# Patient Record
Sex: Female | Born: 1980 | Race: White | Hispanic: No | Marital: Married | Smoking: Never smoker
Health system: Western US, Academic
[De-identification: ages and names within clinical notes are randomized; demographics above are authoritative.]

---

## 2013-05-14 ENCOUNTER — Ambulatory Visit (INDEPENDENT_AMBULATORY_CARE_PROVIDER_SITE_OTHER): Payer: BC Managed Care – PPO

## 2013-05-14 ENCOUNTER — Encounter (INDEPENDENT_AMBULATORY_CARE_PROVIDER_SITE_OTHER): Payer: Self-pay

## 2013-05-14 VITALS — BP 120/70 | HR 64 | Temp 98.1°F | Ht 64.0 in | Wt 115.0 lb

## 2013-05-14 DIAGNOSIS — M79661 Pain in right lower leg: Secondary | ICD-10-CM

## 2013-05-14 DIAGNOSIS — M25561 Pain in right knee: Secondary | ICD-10-CM

## 2013-05-14 DIAGNOSIS — M25569 Pain in unspecified knee: Principal | ICD-10-CM

## 2013-05-14 DIAGNOSIS — M79609 Pain in unspecified limb: Secondary | ICD-10-CM

## 2013-05-15 ENCOUNTER — Ambulatory Visit (INDEPENDENT_AMBULATORY_CARE_PROVIDER_SITE_OTHER): Payer: BC Managed Care – PPO

## 2013-05-15 ENCOUNTER — Encounter (INDEPENDENT_AMBULATORY_CARE_PROVIDER_SITE_OTHER): Payer: BC Managed Care – PPO | Admitting: Physical Medicine and Rehab

## 2013-05-15 ENCOUNTER — Ambulatory Visit (INDEPENDENT_AMBULATORY_CARE_PROVIDER_SITE_OTHER): Payer: BC Managed Care – PPO | Admitting: Physical Medicine and Rehab

## 2013-05-15 ENCOUNTER — Encounter (INDEPENDENT_AMBULATORY_CARE_PROVIDER_SITE_OTHER): Payer: Self-pay

## 2013-05-15 ENCOUNTER — Inpatient Hospital Stay (INDEPENDENT_AMBULATORY_CARE_PROVIDER_SITE_OTHER)
Admit: 2013-05-15 | Discharge: 2013-05-15 | Disposition: A | Discharge status: Home / Self Care | Payer: BC Managed Care – PPO

## 2013-05-15 VITALS — BP 149/88 | HR 73 | Temp 97.7°F

## 2013-05-15 VITALS — BP 138/73 | HR 77 | Temp 97.9°F | Ht 64.0 in | Wt 115.0 lb

## 2013-05-15 DIAGNOSIS — G5730 Lesion of lateral popliteal nerve, unspecified lower limb: Principal | ICD-10-CM

## 2013-05-15 DIAGNOSIS — S8410XA Injury of peroneal nerve at lower leg level, unspecified leg, initial encounter: Secondary | ICD-10-CM

## 2013-05-15 DIAGNOSIS — M25569 Pain in unspecified knee: Principal | ICD-10-CM

## 2013-05-15 DIAGNOSIS — M84369A Stress fracture, unspecified tibia and fibula, initial encounter for fracture: Principal | ICD-10-CM

## 2013-05-15 NOTE — Progress Notes (Addendum)
CLINIC: [facility]  REPORT TYPE: NOTE  Dictating Practitioner: Teresita MaduraHRISTOPHER J Richardson Dubree, M.D.  DATE OF SERVICE:  05/14/2013  REASON FOR VISIT: Right leg pain.    HISTORY OF PRESENT ILLNESS:  The patient is a Adult nursedelightful, athletic, young triathlete whom I am seeing today at the request of Emelia LoronBrian Hill for evaluation of her right leg.  She is [place] in [place] [facility] [place] and on vacation after the [place].    Basically she was in her normal state of health until about January 2015. At that time with the increased running program, she started to develop pain, particularly when she crossed her leg on the posterior aspect of her knee.  She iced and stretched and tried to improve this and got some nominal improvement.  She started a heavy training block in [place] in February  and noticed almost immediate increase in the posterolateral aspect of her knee and had a limp. She took 2 weeks off.  Once again this past weekend she tried to do a tri[place] this [place] in [place]. Basically she was fine during the swim and the bike and when she started running she was fine, until about mile 6 where she started to notice this pain, and by mile 9 she was in tears and had to stop running.  She describes the pain as sharp, throbbing, and stabbing; sometimes radicular symptoms running all the way from her buttocks region down and into the leg and particularly the anterolateral aspect of the leg.  It is accompanied by some stiffness, although she has not noted a rock-hard compartment at that area.  The symptoms have been constant.  They are significant with activity.  She has not seen any other physicians or had any workup for this problem.    PAST MEDICAL HISTORY:  Unremarkable.    ALLERGIES:  TO MORPHINE.    MEDICATIONS:  She put herself on a Medrol Dosepak, as she is a PA and was concerned about some of the neurologic symptoms she was having.    SURGICAL HISTORY:  She has had a right ACL reconstruction in 2004 on the  ipsilateral side, and an elbow ORIF in 2005 without complications.    FAMILY HISTORY:  Unremarkable.    REVIEW OF SYSTEMS:  On a 14-system review of systems, she declined any pertinent positives.    SOCIAL HISTORY:  She is a self-employed PA, currently working for [employer].  She is a married female who has never used tobacco, drinks wine only socially, and does not use recreational drugs. She is an Conservation officer, historic buildingsronman athlete.    PHYSICAL EXAMINATION:  On physical examination she is an in-shape female in no apparent distress; has no obvious hair, eye, or skin abnormalities.  She has an obvious antalgic gait with pain when she tries to actively plantarflex the foot or with weightbearing.  Most of the pain is over the posterolateral aspect of the knee; it starts just proximal to the fibular head and courses around the fibular neck and into the anterior compartment, along the course of the peroneal nerve.  She has fairly exquisite tenderness even with light touch in the peroneal lateral compartment; however, interestingly she has no pain with passive stretch on the compartment and no swelling, and the compartment does not feel tense or woody.  Passive stretch does not give her pain; active attempts at dorsiflexion or eversion caused pain, again along the peroneal distribution, with a radicular nature.  Sensation is intact in the 1st  dorsal web space.  She has got 5/5 strength in ankle dorsiflexion and EHL; resisted plantarflexion does give her some discomfort.    IMPRESSION:  Certainly a strange constellation of symptoms that could fit a few different differential diagnoses.  One might be a proximal fibular stress fracture or stress reaction.  Secondarily she may have a peroneal nerve entrapment syndrome where she is getting peroneal nerve entrapment as it is entering the fascia around the peroneal musculature.  Less likely would be a chronic exertional compartment syndrome, as this does not exactly fit with her   appearance.  She does not appear to have any acute compartment syndrome at this time.  My thought was to get an MRI of the leg and knee to assess for whether or not she might have a Baker's cyst (related to her ACL reconstruction) or a ganglion cyst from the tib-fib joint compressing the peroneal nerve; it is also to assess to see whether or not there is some component of a stress reaction in the fibula; if these are negative, I still might recommend a peroneal nerve EMG to assess this, or exertional compartment testing, although at this point she could never complete the test.  I do think again there is a very, very low index of suspicion for an acute compartment syndrome, given the good muscle development, no pain on passive stretch, and stable symptomatology.

## 2013-05-15 NOTE — Progress Notes (Signed)
NERVE CONDUCTION STUDIES/ELECTROMYOGRAPHY EXAMINATION    PRIMARY CARE PROVIDER  Provider, Not In System    CONSULTING/REFERRING PHYSICIAN  Provider, Not In System    Gaynel Silbernagel 33 year old female  VS: There were no vitals taken for this visit.   BMI: There is no weight on file to calculate BMI.  Hand Dominance: n/a    CC/Reason for referral:  No chief complaint on file.    No chief complaint on file.      HPI: Referred for electrophysiologic examination due to following symptoms: R lateral knee pain in Iron[employer] triathlete, altered sensations and parasthesias.    PMH: No past medical history on file.    PSH: No past surgical history on file.    A brief neurological examination demonstrated Tinel's at fibular neck, neg at tarsal tunnel. She can grossly move her ankle in dorsiflexion, plantarflexion, inversion and eversion although she reports significant pain with this and thus limited testing of motor strength.       Contraindications/Restrictions:  Implantable cardiac defibrillator: no  Cardiac pacemaker: no  Other implantable device: no  Central lines: none  On anticoagulation: denies  Bleeding disorder: denies  Open wound: none  Current skin infection: none    Study limitations:  Lotion on skin resulted in interference/poor baseline on sural sensory but opposite side superficial peroneal sensory was obtainable. She had significant pain during NCS and EMG and reduced tolerance to testing.    Nerve Conduction Studies:  For sensory NCS, the amplitude is measured peak-to-peak, the latency reported is the distal peak latency, and the conduction velocity, if measured, is determined from onset latencies. For motor nerve conduction studies, the amplitude is measured baseline-to-peak, the latency reported is the distal onset latency, the conduction velocity is calculated from the latencies. Please see attached data set for individual values.    Limb temperature:  Unless otherwise noted, the limb temperature was monitored  continuously and remained between at least 32C for upper extremities (30C for lower) and 36C during the performance of the NCSs.     Needle Electromyography:  The study was performed with a new sterile disposable monopolar needle electrode, which was then discarded. There were no complications. Fibrillation and fasciculation activity is graded from none (0) to continuous (4+). The configuration and recruitment pattern of motor unit action potentials under voluntary control, if not normal, are described in attached chart.     Summary of Findings:  Please see attached data.    Diagnostic Interpretation:  1. Abnormal study.  2. 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 Tibial motor nerve was within normal limits.  3. There was no clear electrodiagnostic evidence to suggest peripheral neuropathy or radiculopathy on current study.   4. Limitations on interpretation: mild/minimal due to technical factors listed above. Test quality: adequate.  5. Previous studies: none.    Recommendations:  1. Results were discussed with the patient. Reviewed nerve recovery expectations for demyelinating vs axonal injuries if nonsurgical intervention is planned.  2. Additional NCS and needle EMG examination may be done to monitor for improvement or if further testing is desired such as inching studies across the fibular head.  3. MRI 'in process,' no report uploaded at time of study however she states she knows she has a stress fracture proximal  fibula.  4. She states she is already on steroid taper which may help if there is inflammation affecting peroneal nerve.   5. Follow up: she has a follow up appointment today with Dr. Smith MinceWahl. [facility] and [facility] to relay  findings.      Physician performing the study: Lupe CarneyKenneth Daden Mahany, MD  Technician: none.  Instrument manufacturer/model: [employer]well Sierra Wave.     [facility]  [address], [address], [place], [place] [id number]  [phone]  Test Date:  05/15/2013    Patient: Maree Krabbeatiana Topor Physician: Lupe CarneyKenneth Tivis Wherry, M.D.   Sex: Female Ref.Physician: wahl,christopher   ID#: [id number] Technician: Lupe CarneyKenneth Modest Draeger, M.D.     EMG FINDINGS:  A disposable monopolar needle electrode was used in evaluation of selected muscles (see table). the Right Peroneus Long showed increased insertional activity and slightly increased spontaneous activity.  The Right Ext Dig Brev showed increased insertional activity.  All remaining muscles (see table) showed no evidence of electrical instability or abnormal motor unit potentials.        NCV FINDINGS:  Evaluation of the Right Peroneal Motor nerve showed decreased conduction velocity (B Fib-Ankle).  The Left Sup Peron Anti Sensory nerve was within normal limits.  The Right Sup Peron Anti Sensory nerve showed prolonged distal peak latency, decreased conduction velocity (12 cm-Ant Lat Mall), prolonged distal peak latency, and decreased conduction velocity (12 cm-Ant Lat Mall).  All remaining nerve conduction parameters (as indicated in the following tables) were within normal limits.      Nerve Conduction Studies  Anti Sensory Summary Table     Site NR Peak (ms) Norm Peak (ms) P-T Amp (V) Norm P-T Amp Dist (cm)   Left Sup Peron Anti Sensory (Ant Lat Mall)  28.7C   12 cm    2.3 <3.5 59.8 >8 12.0   Right Sup Peron Anti Sensory Run #1 (Ant Lat Mall)  28.1C   12 cm    4.2 <3.5 21.5 >8 12.0   Right Sup Peron Anti Sensory Run #2 (Ant Lat Mall)  28.2C   12 cm    4.3 <3.5 31.1 >8 12.0     Motor Summary Table     Site NR Onset (ms) Norm Onset (ms) O-P Amp (mV) Norm O-P Amp Dist (cm) Vel (m/s) Norm Vel (m/s)   Right Peroneal Motor (Ext Dig Brev)  27.5C   Ankle    5.4 <6.0 3.3 >2. 22.0 29  >40   B Fib    13.0  2.9  9.5 53 >40   Poplt    14.8  2.9       Right Tibial Motor (Abd Hall Brev)  29.1C   Ankle    5.9 <6.0 4.8 >4.0 38.0 43 >40   Knee    14.8  3.3         EMG     Side Muscle Nerve Root Ins Act Fibs Psw Amp Dur Poly Recrt Int Dennie BiblePat Comment   Right VastusMed Femoral L2-4 Nml Nml Nml Nml Nml 0 Nml Nml    Right AntTibialis Dp Br Peron L4-5 Nml Nml Nml Nml Nml 0 Nml Nml    Right MedGastroc Tibial S1-2 Nml Nml Nml Nml Nml 0 Nml Nml    Right Peroneus Long Sup Br Peron L5-S1 Incr Nml 1+ Nml Nml 0 Nml Nml    Right Upper Lumbar Parasp Rami L1-2 Nml Nml Nml Nml Nml 0 Nml Nml    Right Mid  Lumbar Parasp Rami L3-4 Nml Nml Nml Nml Nml 0 Nml Nml    Right Lower Lumbar Parasp Rami L5-S1 Nml Nml Nml Nml Nml 0 Nml Nml    Right Ext Dig Brev Dp Br Peron L5, S1 Incr Nml Nml Nml Nml 0 Nml Nml    Right BicepsFemS Sciatic(Per) L5-S1 Nml Nml Nml Nml Nml 0 Nml Nml    Right Ext Dig Long Dp Br Peron L5-S1 Nml Nml Nml Nml Nml 0 Nml Nml    Right PostTibialis Tibial L5, S1 Nml Nml Nml Nml Nml 0 Nml Nml        Waveforms:

## 2013-05-16 NOTE — Progress Notes (Addendum)
CLINIC: [facility]  REPORT TYPE: NOTE  Dctating Practitioner: Teresita MaduraHRISTOPHER J Earley Grobe, M.D.  DATE OF SERVICE:  05/15/2013  REASON FOR VISIT: Follow up MRI and EMG studies of the right leg.    SUBJECTIVE:  The patient returns today for another evaluation 1 day later of her right leg.  I saw her yesterday with a fairly complex problem.  She had been running a marathon and suffered sudden rapid increase in pain with neurologic pain and symptoms into the anterior compartment of her leg. 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 Also, did not have any evidence or sign of an acute compartment injury. She had significant trouble weightbearing and, at the time I examined her, severe pain along the common peroneal nerve, particularly at the fibular neck and over both posterior and anterior aspects of the fibula in this region.  We had some clinical concern for a fibula stress fracture and/or, because of the neurogenic symptoms, some question of nerve injury or nerve incarceration of the peroneal here.  For this reason, we obtained EMGs and an MRI, and she is in today for followup.    MRI of the fibula demonstrates an obvious marrow edema surrounding periosteal elevation and edema of the proximal fibula at the metadiaphyseal junction proximally.  This is at the narrowest point of her fibula notably. There does appear to be, in the coronal images, some small step-off with perhaps an incomplete cortical fracture in this region.  Notably, this is in the area exactly where the peroneal nerve crosses the fibular neck in this zone.    An EMG performed by Dr. Seward CarolVitale is also reviewed.  This was discussed as well with one of our neurologists, Dorna BloomSean J. Evans, MD here.  This EMG was notable for some evidence of a slight demyelinating injury that looked acute of the proximal common peroneal nerve.  There is no evidence in the  musculature of a compartment syndrome or residual findings of an acute compartment reaction.  The nerve findings were mildly involving both motor and cutaneous function of the nerve.    IMPRESSION:  1. I think she has a proximal fibula stress fracture which has probably caused some irritation of the common peroneal nerve.  2. Based on her gait and alignment evaluation, she has hindfoot valgus and some midfoot collapse.    RECOMMENDATIONS:  My recommendations would be:  1. 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  2. I would like her to be nonweightbearing for at least 2 weeks on the fibula until it is painless, and then she can start gently weightbearing without impact for a minimum of 6 weeks, then ramping up by the "1/6th x 6 program.".  3. When she is feeling no significant pain at the fibula, she can start swimming with pool buoys and even do some light spinning on the bike as long as it is not causing increased symptoms.

## 2013-05-22 ENCOUNTER — Encounter (INDEPENDENT_AMBULATORY_CARE_PROVIDER_SITE_OTHER): Payer: BC Managed Care – PPO | Admitting: Physical Medicine and Rehab

## 2023-04-09 IMAGING — MR MRI LUMBAR SPINE WITHOUT CONTRAST
7 of 11 series · 14 of 48 positions shown · IV contrast (gadolinium)
Comparison: None.

________________________________________________________________________________________________ 
MRI LUMBAR SPINE WITHOUT CONTRAST, 04/09/2023 [DATE]: 
CLINICAL INDICATION: Chronic unresolving low back pain.
TECHNIQUE: Multiplanar, multiecho position MR images of the lumbar spine were 
performed without intravenous gadolinium enhancement. Patient was scanned on a 
1.5T magnet

[Series 101: survey · axial · 10.0mm · 1.25mm/px · 1 of 10 slices shown]
[im 1/10]
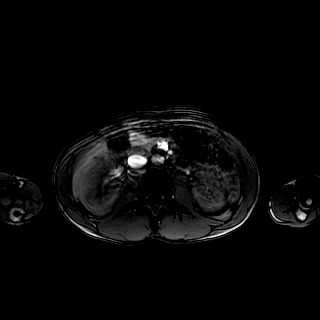

[Series 201: t2w_cor-surv · coronal · 6.0mm · 0.62mm/px · 1 of 10 slices shown]
[im 1/10]
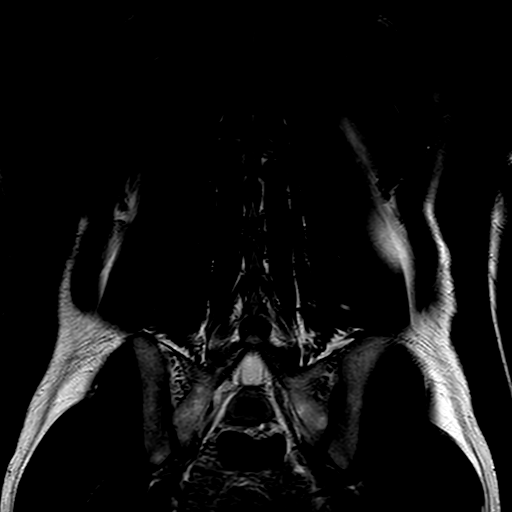

[Series 301: t1_tse_sag · sagittal · 4.0mm · 0.44mm/px · 2 of 19 slices shown]
[im 1/19]
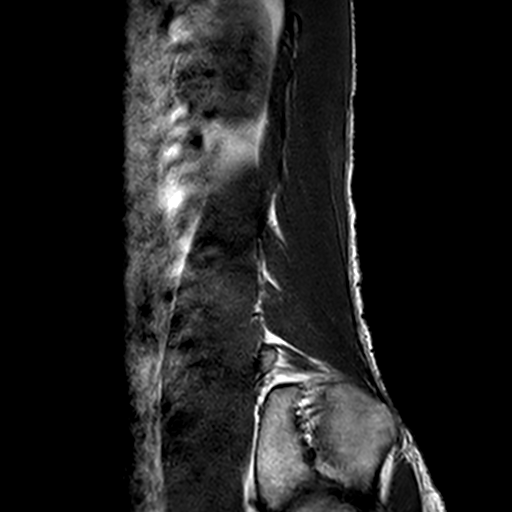
[im 19/19]
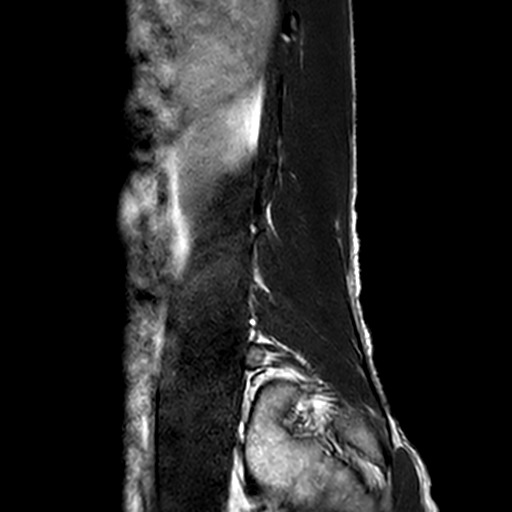

[Series 402: (id)_mdixon_tse · sagittal · 4.0mm · 0.53mm/px · 2 of 19 slices shown]
[im 1/19]
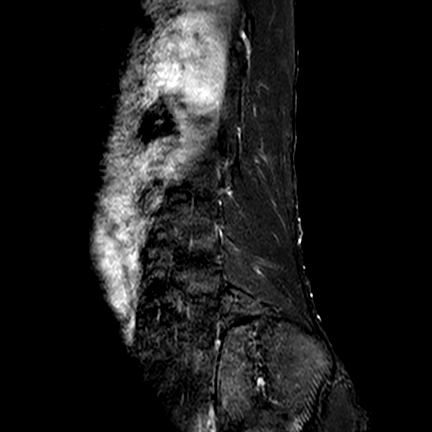
[im 19/19]
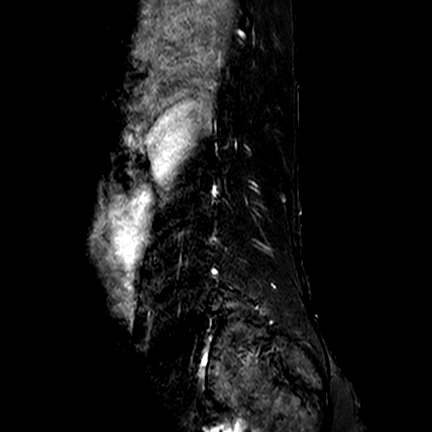

[Series 403: st2w_mdixon_tse · sagittal · 4.0mm · 0.53mm/px · 2 of 19 slices shown]
[im 1/19]
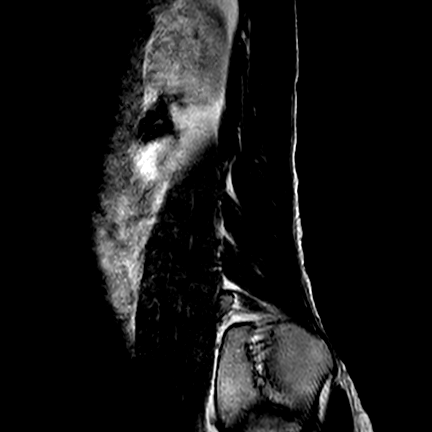
[im 19/19]
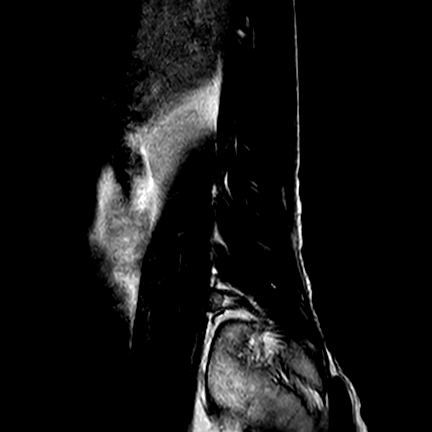

[Series 502: (id) view_ax mpr · axial · 1.0mm · 0.25mm/px · z∈[-138,-99]mm · 2 of 121 slices shown]
[im 1/121]
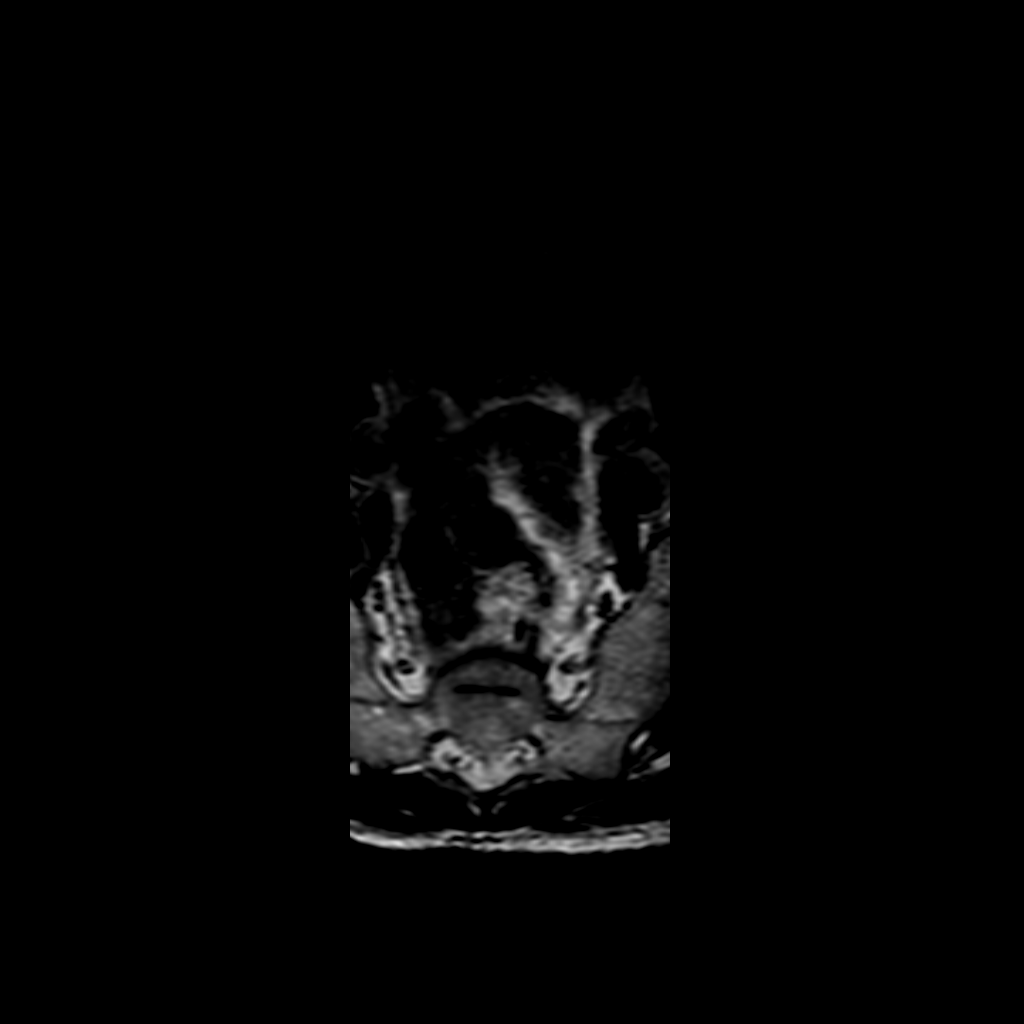
[im 21/121]
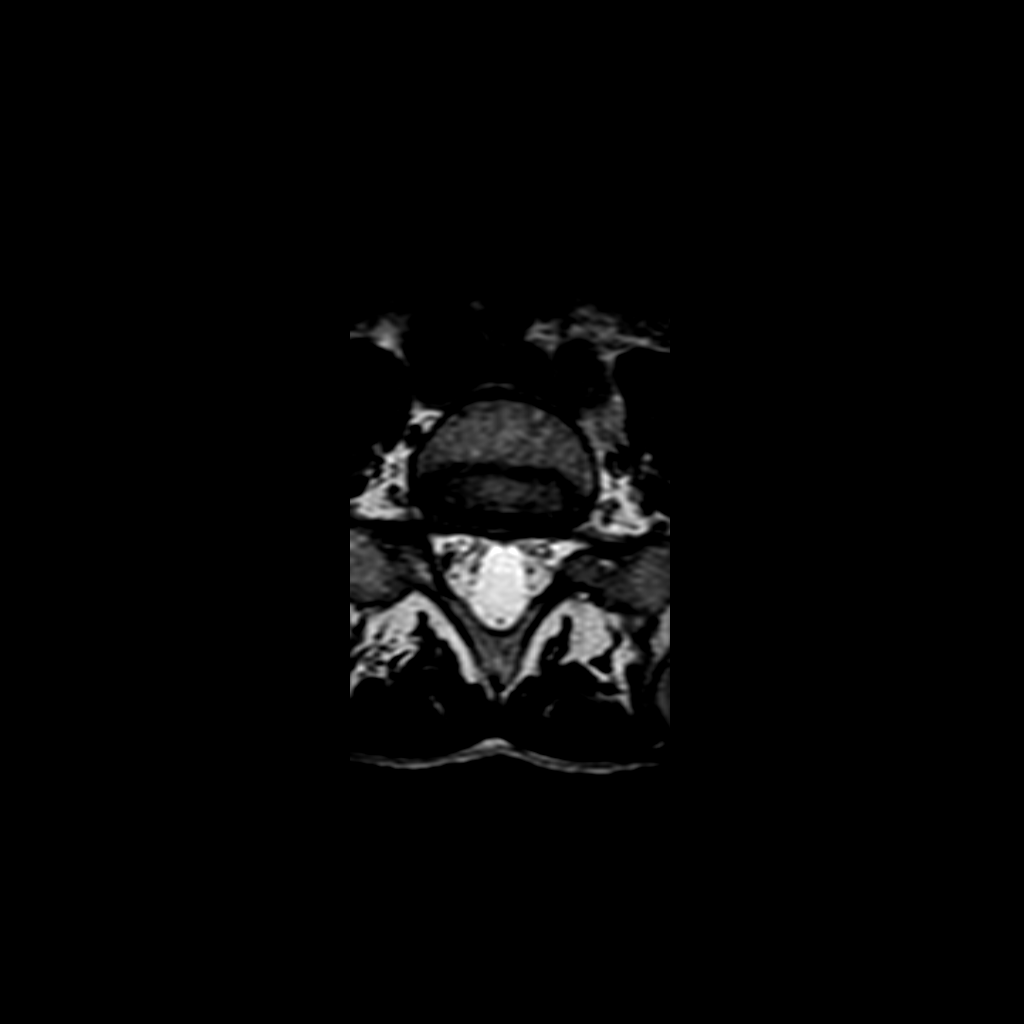

[Series 701: T1 · axial · 4.0mm · 0.39mm/px · z∈[-147,+35]mm · 4 of 42 slices shown]
[im 1/42]
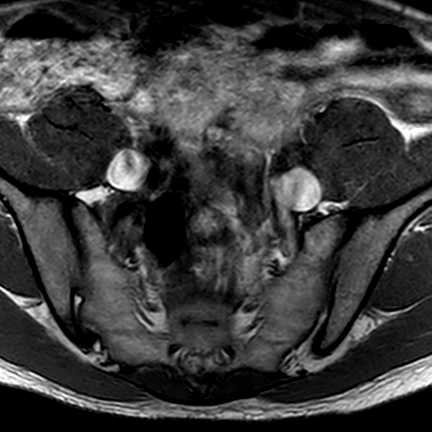
[im 14/42]
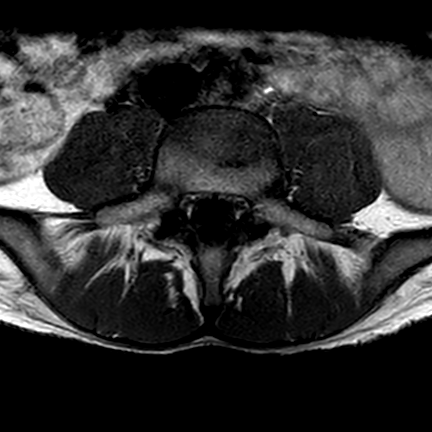
[im 28/42]
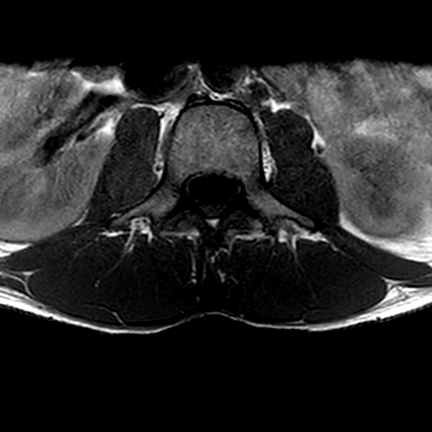
[im 42/42]
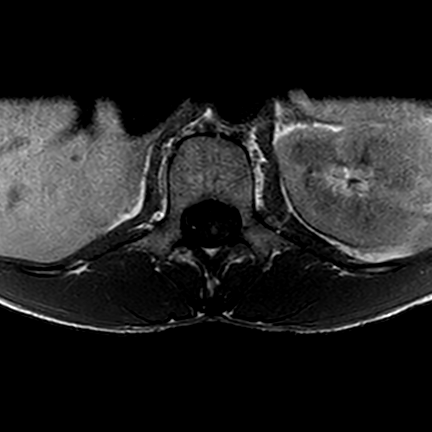

[14 of 48 positions shown; findings below may reference images not displayed]

FINDINGS: -------------------------------------------------------------------------------- 
------ 
GENERAL: 
Nomenclature is based on 5 lumbar type vertebral bodies.     
ALIGNMENT: Normal. 
VERTEBRAL BODY HEIGHT: Normal.  
MARROW SIGNAL: No focal suspect signal abnormality. 
CORD SIGNAL: Normal distal spinal cord and cauda equina.  Conus terminates at 
L1-L2 level. 
ADDITIONAL FINDINGS: None. 
Modic I-II: None. 
Ligamentum Flavum > 2.5 mm: All levels. 
-------------------------------------------------------------------------------- 
------ 
SEGMENTAL: 
T12-L1: Trace disc bulge; no significant central canal narrowing.  No 
significant right neural foraminal narrowing. No significant left neural 
foraminal narrowing.  
L1-L2: No significant central canal narrowing.  No significant right neural 
foraminal narrowing. No significant left neural foraminal narrowing.  
L2-L3: No significant central canal narrowing.  No significant right neural 
foraminal narrowing. No significant left neural foraminal narrowing.  
L3-L4: Trace disc bulge; no significant central canal narrowing.  No significant 
right neural foraminal narrowing. No significant left neural foraminal 
narrowing.  
L4-L5: Mild bilateral facet hypertrophy; no significant central canal narrowing. 
 No significant right neural foraminal narrowing. No significant left neural 
foraminal narrowing.  
L5-S1: No significant central canal narrowing.  No significant right neural 
foraminal narrowing. No significant left neural foraminal narrowing.  
-------------------------------------------------------------------------------- 
------
IMPRESSION: 1.  Mild discogenic/degenerative changes as above. 
2.  No significant central canal or neural foraminal narrowing at any level.

## 2023-04-10 IMAGING — MR MRI PELVIS WITHOUT CONTRAST
4 of 6 series · 13 of 48 positions shown · IV contrast (gadolinium)
Comparison: Report only of abdominal ultrasound of 03/26/2019.

________________________________________________________________________________________________ 
MRI PELVIS WITHOUT CONTRAST, 04/10/2023 [DATE]: 
CLINICAL INDICATION: Low back pain right buttock pain rating down leg. Patient 
is an athlete.
TECHNIQUE: Multiplanar, multiecho position MR images of the pelvis were 
performed without intravenous gadolinium enhancement. Patient was scanned on a 
1.5T magnet.

[Series 101: survey · axial · 10.0mm · 1.25mm/px · z∈[-34,+200]mm · 3 of 11 slices shown]
[im 1/11]
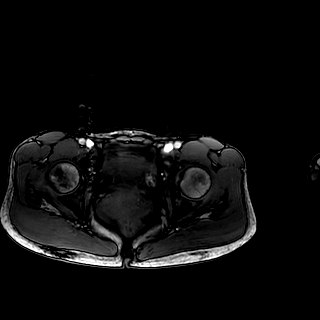
[im 6/11]
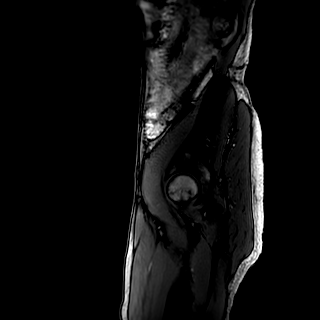
[im 11/11]
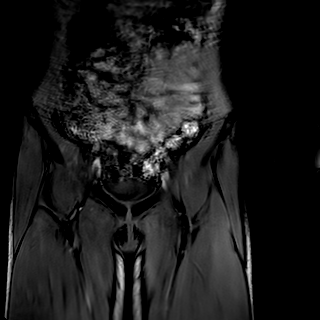

[Series 201: t1_(person_name) · axial · 6.0mm · 0.37mm/px · z∈[-149,+91]mm · 4 of 36 slices shown]
[im 1/36]
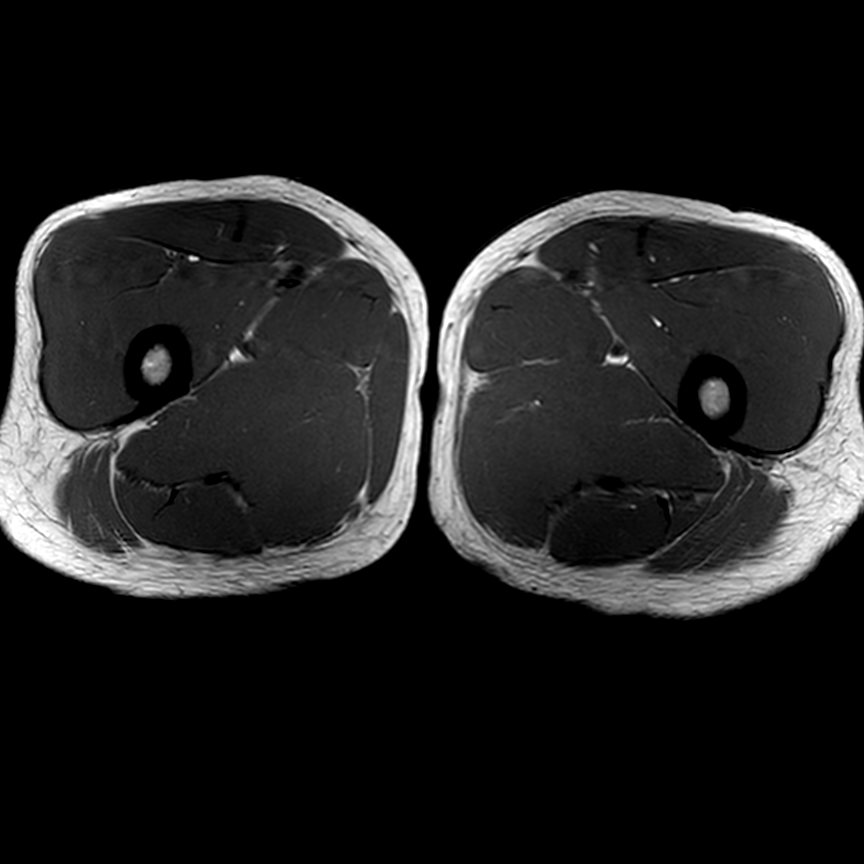
[im 5/36]
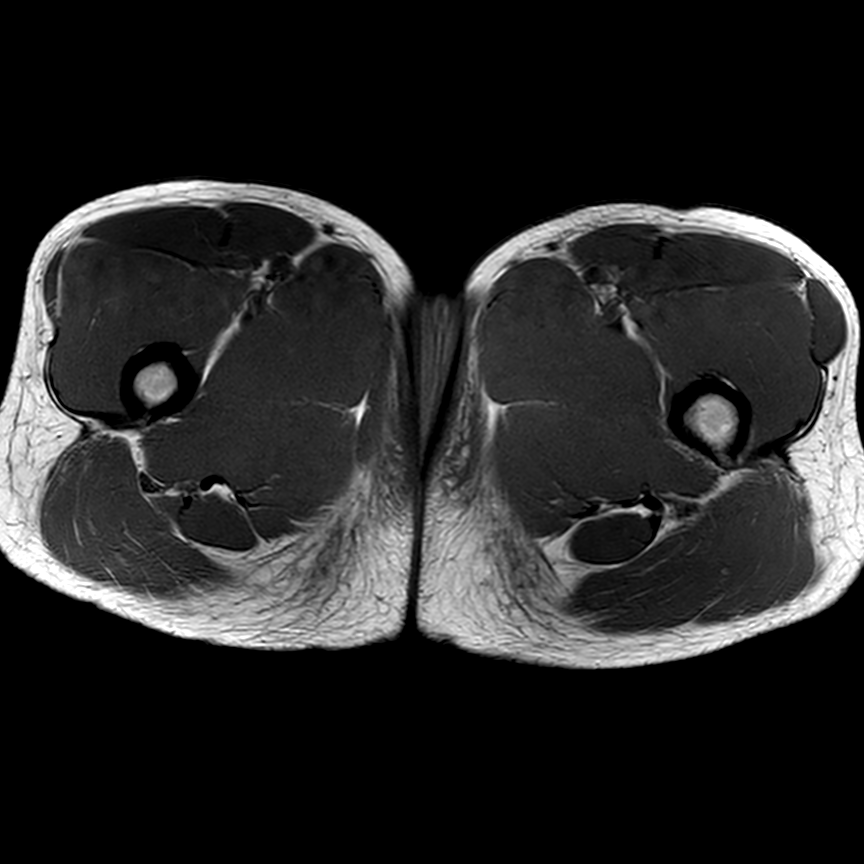
[im 18/36]
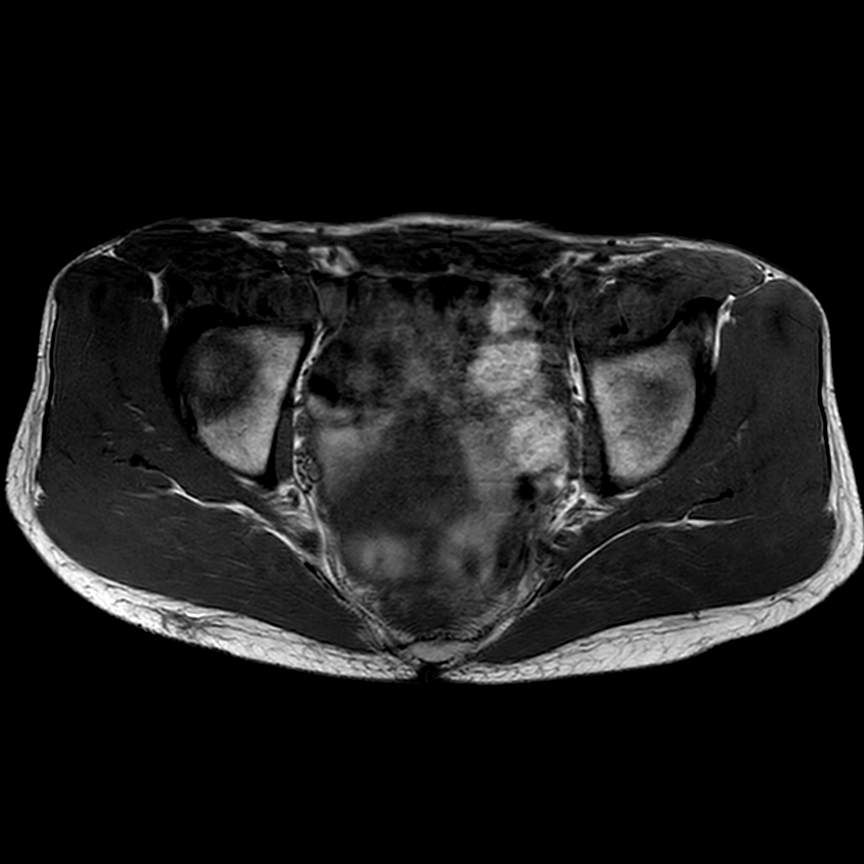
[im 31/36]
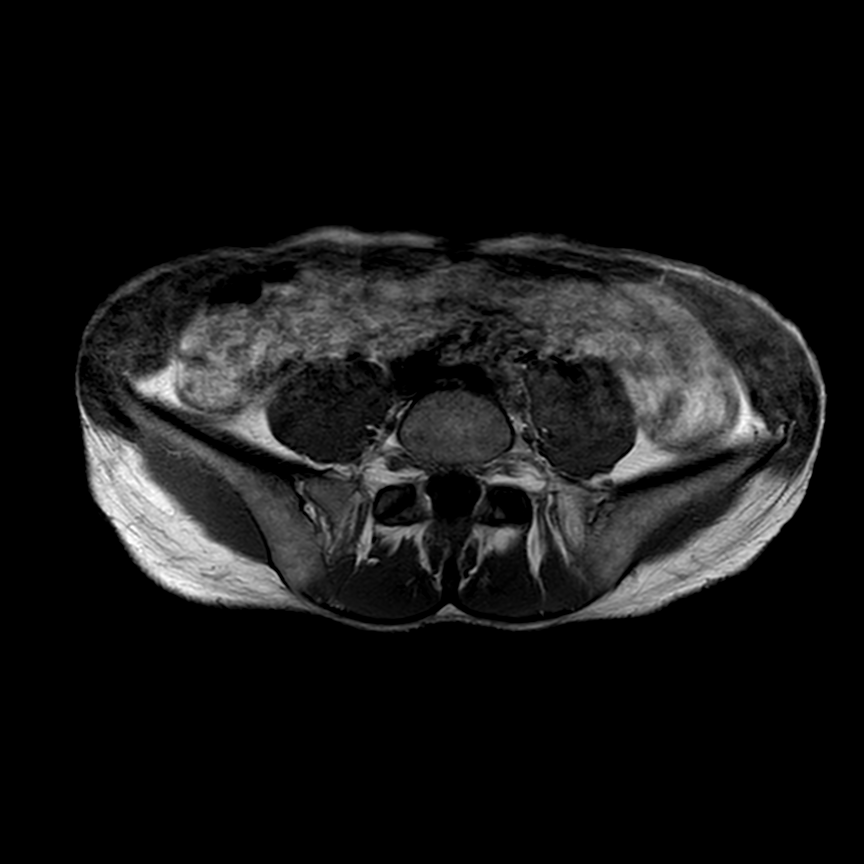

[Series 301: (person_name)_(person_name)_(person_name) · axial · 6.0mm · 0.62mm/px · z∈[-117,+91]mm · 3 of 36 slices shown]
[im 5/36]
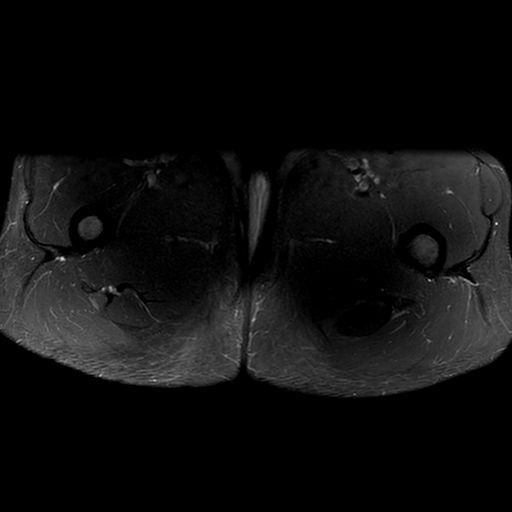
[im 18/36]
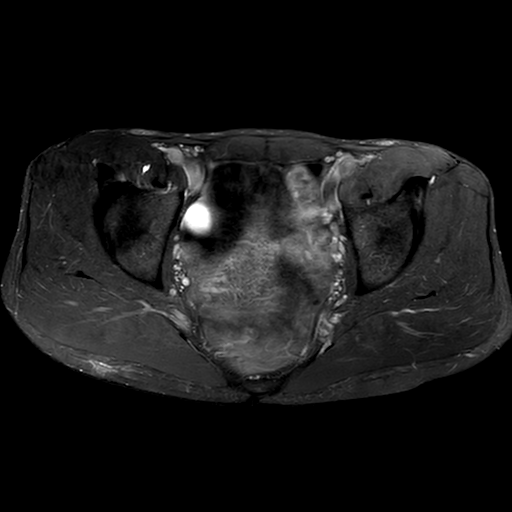
[im 31/36]
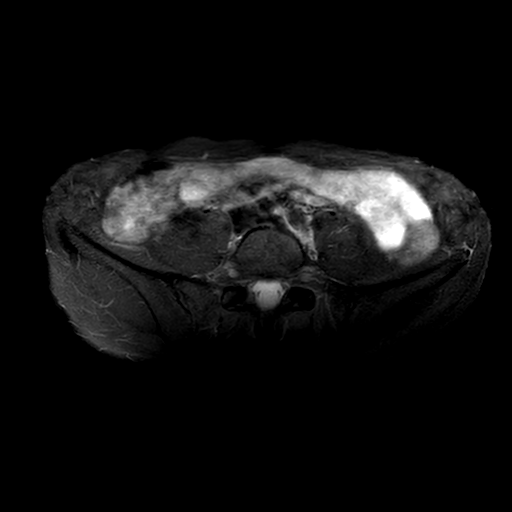

[Series 401: t1_cor · coronal · 5.0mm · 0.62mm/px · 3 of 30 slices shown]
[im 5/30]
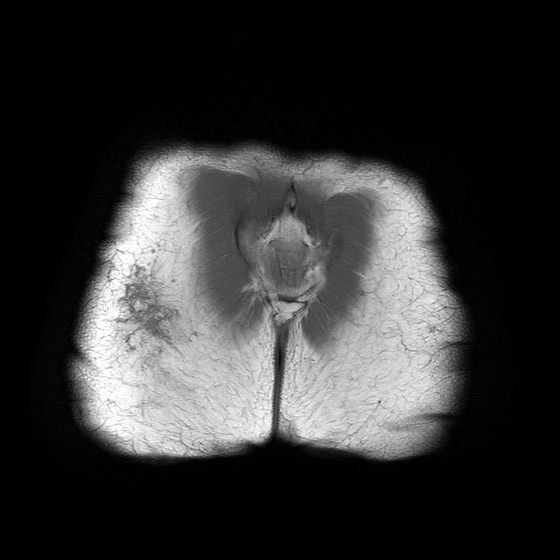
[im 15/30]
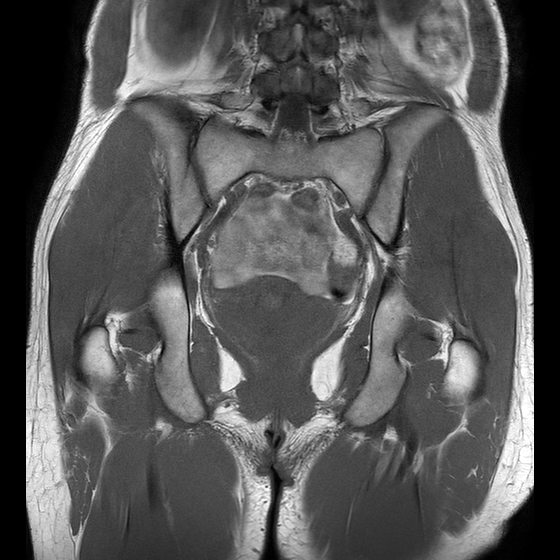
[im 25/30]
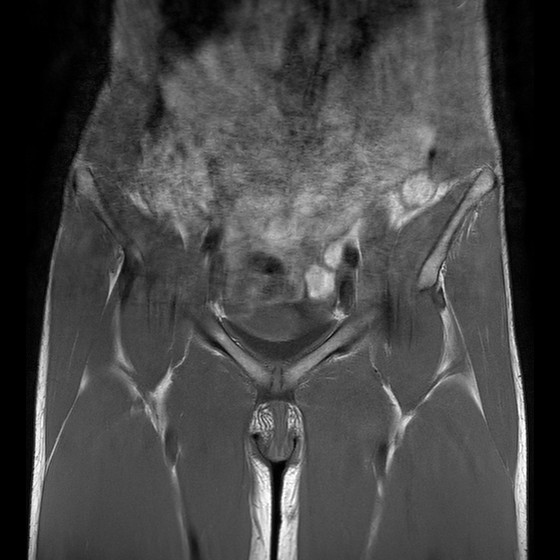

[13 of 48 positions shown; findings below may reference images not displayed]

Right sacroiliac 
pain and numbness that extends down to catheter dermatome, rule out piriformis 
hypertrophy causing superior gluteal nerve or static nerve compression. Evaluate 
for right sacroiliac joint DJD.
FINDINGS: HIPS:  No discrete articular cartilaginous loss of either hip. No labral tear. 3 
mm acetabular cyst on the right. No paralabral cyst. No hip joint effusion. Both 
femoral heads maintain a spherical configuration without evidence of avascular 
necrosis or subarticular collapse. No abnormal morphology of the proximal femurs 
or acetabulum to predispose to impingement  
BONES: Normal marrow signal intensity of the proximal hips, pelvis, sacrum and 
included lower lumbar spine. No fracture, contusion or marrow replacing lesion. 
Included lumbar spine is negative. SI joints are negative. No erosion, marrow 
edema or ankylosis. No findings to suggest acute or chronic sacroiliitis. 
SOFT TISSUES: The bilateral abductor cuffs are preserved. The insertions of the 
iliopsoas tendons are intact. Mild signal abnormality and thickening of the 
origin of the right semimembranosus. Rectus abdominis-adductor complex is 
preserved. The musculature is symmetric without strain, atrophy or mass. 
Specifically, the piriformis musculature is symmetric. No anomalous origin or 
insertions of the piriformis musculature. No focal fluid collection or distended 
bursa. Specifically, no iliopsoas or trochanteric bursitis. Included 
neurovascular bundles are negative. Specifically, the included portions of the 
sciatic nerves and the superior gluteal nerve are negative. Right ovarian 
follicular type cysts with the largest measuring 2.3 cm. Trace free fluid within 
the pelvis is likely physiologic. Mild edema within the dorsal right 
subcutaneous tissues overlying the gluteal musculature.
IMPRESSION: 1.  Mild tendinosis of the origin of the right semimembranosus. 
2.  Right-sided ovarian follicular type cysts. 
3.  Nonspecific dorsal right subcutaneous edema.
